# Patient Record
Sex: Female | Born: 1980
Health system: Southern US, Academic
[De-identification: ages and names within clinical notes are randomized; demographics above are authoritative.]

---

## 2019-12-27 ENCOUNTER — Other Ambulatory Visit (HOSPITAL_COMMUNITY): Payer: Self-pay

## 2019-12-27 ENCOUNTER — Telehealth (INDEPENDENT_AMBULATORY_CARE_PROVIDER_SITE_OTHER): Payer: Self-pay | Admitting: Neurology

## 2019-12-27 DIAGNOSIS — G250 Essential tremor: Secondary | ICD-10-CM

## 2020-01-10 ENCOUNTER — Telehealth (INDEPENDENT_AMBULATORY_CARE_PROVIDER_SITE_OTHER): Payer: Self-pay | Admitting: Neurology

## 2020-01-10 NOTE — Telephone Encounter (Signed)
Can see the neurosurgery team first if she is specifically interested in discussing HIFU, but otherwise seems like she needs a bigger discussion of tremor treatment prior to her being really ready for a procedural treatment for tremor.  I would NOT put her in [facility] clinic yet, until someone in-house evaluates her.     Shirlee More, MD  01/10/2020, 13:48

## 2020-01-10 NOTE — Telephone Encounter (Signed)
[facility]      Operated by [facility]    Date:  01/10/2020  Name: Kathy Anderson  MRN: [id number]  Age:  39 y.o.    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 Writing, eating, holding anything     Who is managing your disease?  Renee Pfeffier, CRNP    Medication management    Essential Tremor:  Pt is not currently on any medication for tremor due to trying to get pregnant.  She stated she was on one before but forgets what its called.     Any major thinking concerns? Pt admits to be experiencing increased forgetfulness    Surgical Questionnaire:   Are you interested in surgery (DBS) or HIFU? Yes - HIFU   Would surgery improve your QOL or ability to perform ADL's?? Yes  Any implanted devices or stimulators? NO  Any use of anti-coagulation? NO    Clinic to be assigned: [facility]. Referral sent to Walker Kehr and Ralene Cork for review and Neurosurgeon assignment if applicable.  Awaiting response prior to scheduling. Screening tool routed to Walker Kehr and [employer].       Patient advised that they must bring all relevant notes and imaging studies as available from their PCP, Neurologist, and/or Psychiatrist to their appointment.     Kathy Anderson Kathy Anderson  01/10/2020, 12:11

## 2020-01-21 ENCOUNTER — Telehealth (INDEPENDENT_AMBULATORY_CARE_PROVIDER_SITE_OTHER): Payer: Self-pay | Admitting: Neurological Surgery

## 2023-09-12 IMAGING — MR MRI CERVICAL SPINE WITHOUT AND WITH CONTRAST
8 series · 46 of 48 positions shown · IV contrast (10ml prohance)
Comparison: none

﻿

Pertinent Hx:    Muscle weakness.  Possible MS.
TECHNIQUE: T1-weighted, T2-weighted, and inversion recovery sagittal images of the cervical spine were taken.  T1-weighted, T2-weighted, and T2* axial images were also obtained.   Intravenous 10 mL ProHance contrast material was given.  Enhanced images were taken in axial and sagittal planes.

[Series 3: STIR · sagittal · 3.0mm · 0.39mm/px · 4 of 15 slices shown]
[im 1/15]
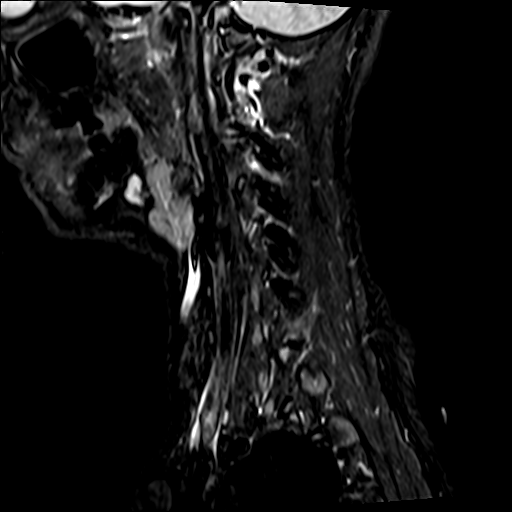
[im 5/15]
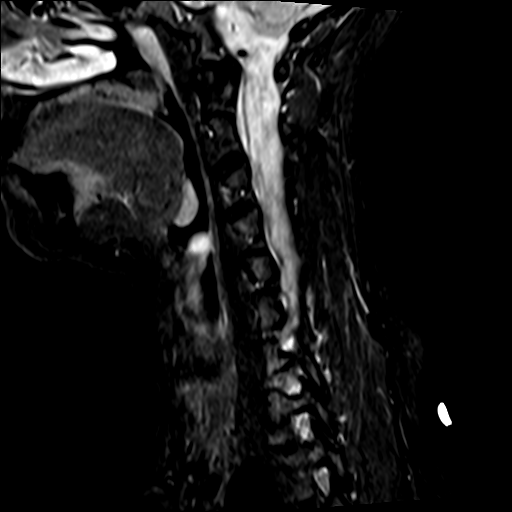
[im 10/15]
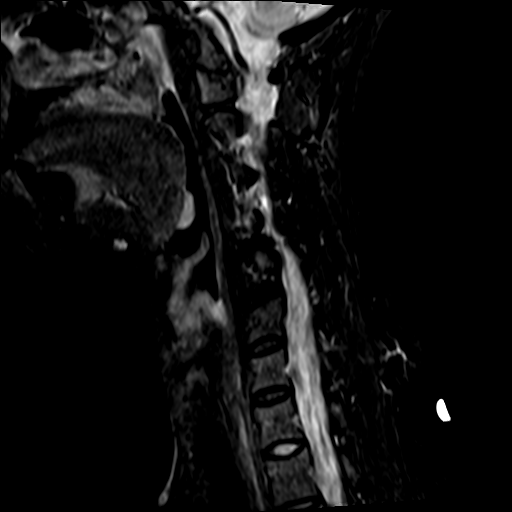
[im 15/15]
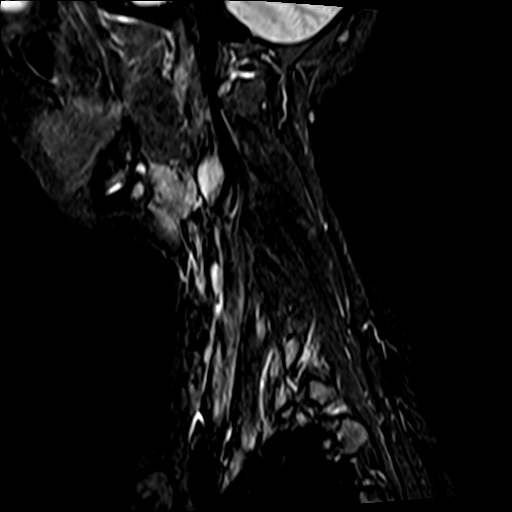

[Series 4: T2 · sagittal · 3.0mm · 0.39mm/px · 4 of 15 slices shown (1 of 3)]
[im 1/15]
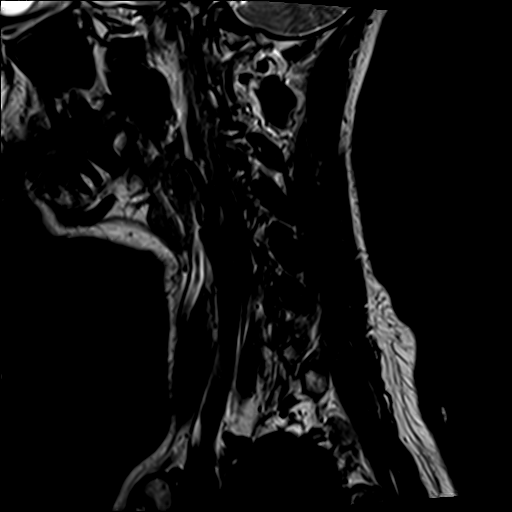
[im 5/15]
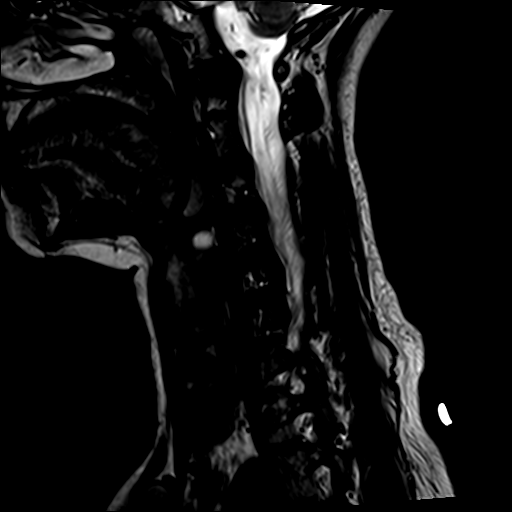
[im 10/15]
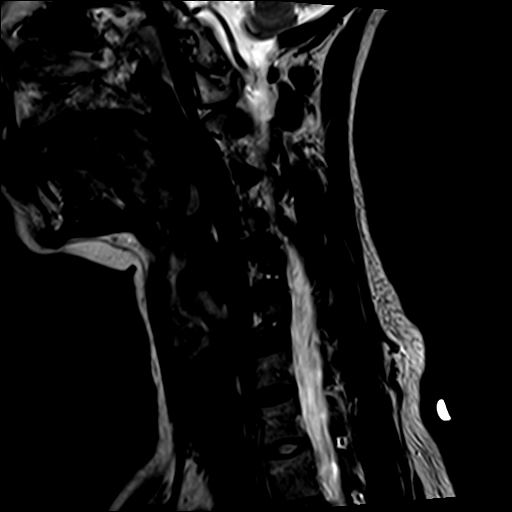
[im 15/15]
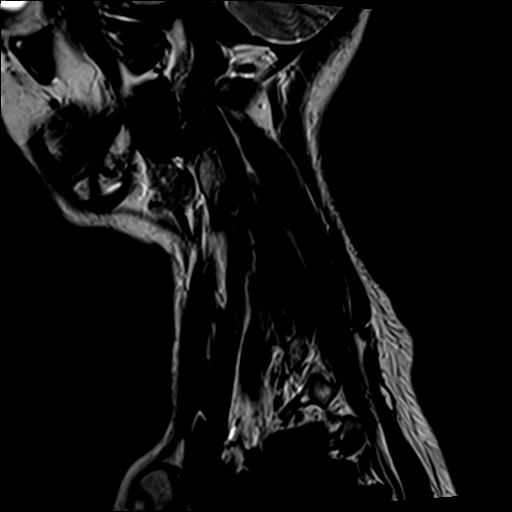

[Series 5: T2 · axial · 4.0mm · 0.62mm/px · z∈[-206,-89]mm · 8 of 25 slices shown (2 of 3)]
[im 1/25]
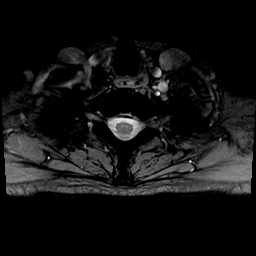
[im 4/25]
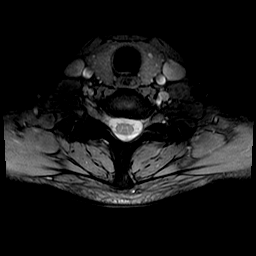
[im 7/25]
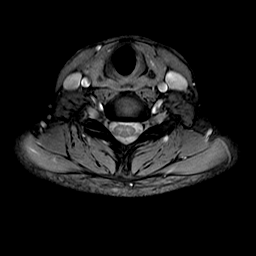
[im 11/25]
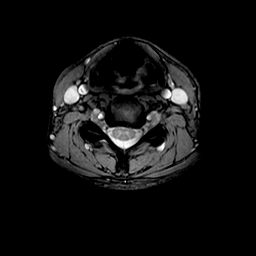
[im 14/25]
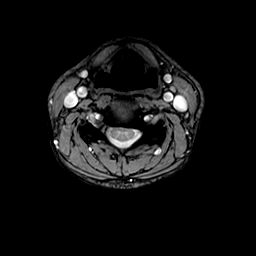
[im 18/25]
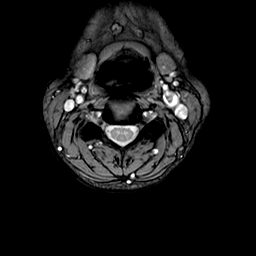
[im 21/25]
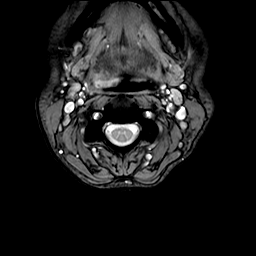
[im 25/25]
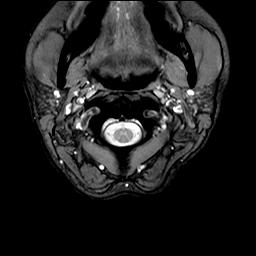

[Series 6: T1 · axial · 4.0mm · 0.62mm/px · z∈[-206,-89]mm · 8 of 25 slices shown (1 of 2)]
[im 1/25]
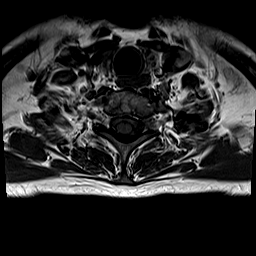
[im 4/25]
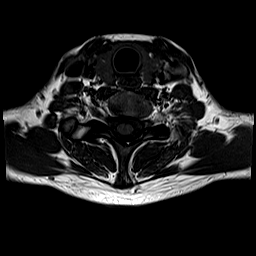
[im 7/25]
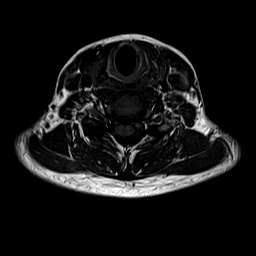
[im 11/25]
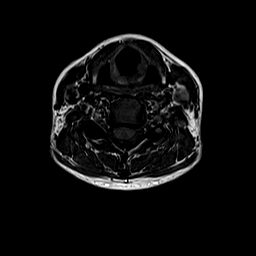
[im 14/25]
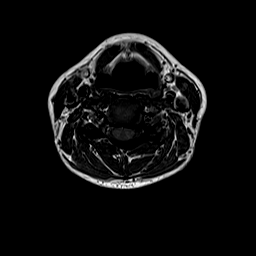
[im 18/25]
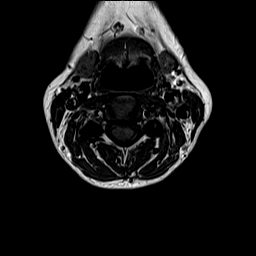
[im 21/25]
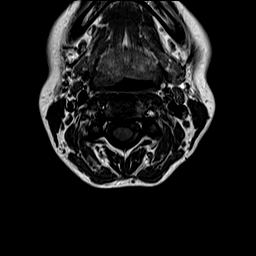
[im 25/25]
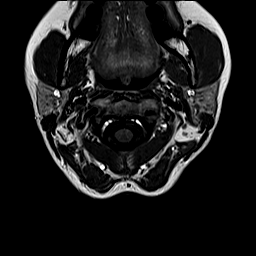

[Series 7: T2 · axial · 4.0mm · 0.62mm/px · z∈[-206,-89]mm · 8 of 25 slices shown (3 of 3)]
[im 1/25]
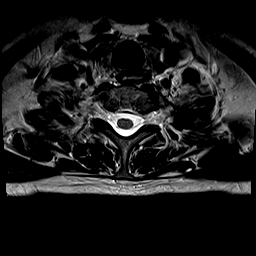
[im 4/25]
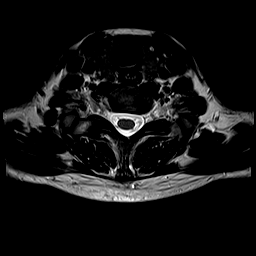
[im 7/25]
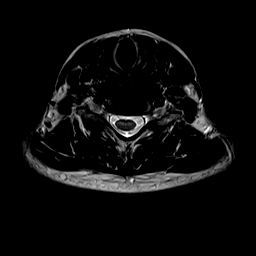
[im 11/25]
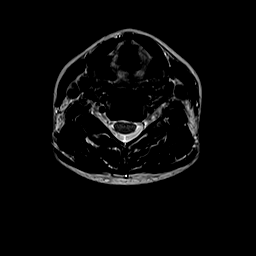
[im 14/25]
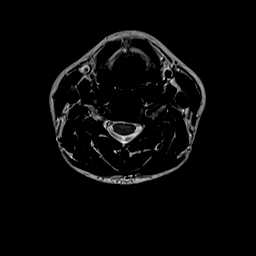
[im 18/25]
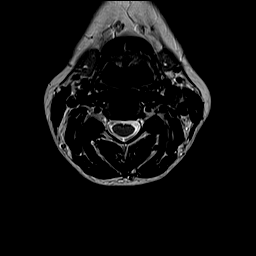
[im 21/25]
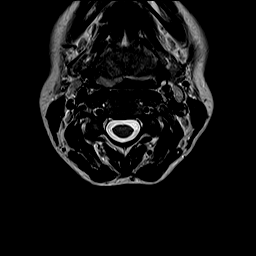
[im 25/25]
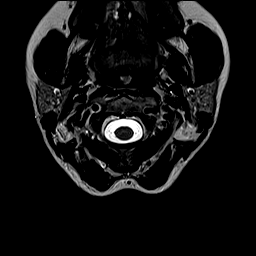

[Series 8: T1 · sagittal · 3.0mm · 0.78mm/px · 4 of 15 slices shown (2 of 2)]
[im 1/15]
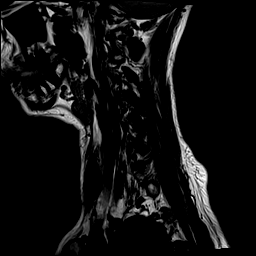
[im 5/15]
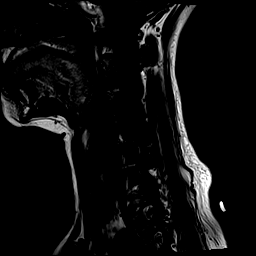
[im 10/15]
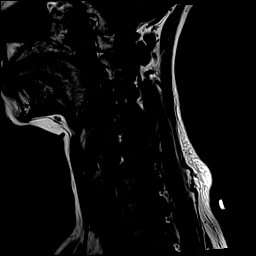
[im 15/15]
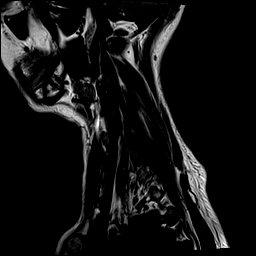

[Series 9: T1 post-contrast · sagittal · 3.0mm · 0.78mm/px · 4 of 15 slices shown (1 of 2)]
[im 1/15]
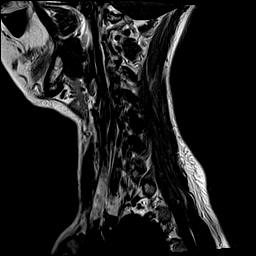
[im 5/15]
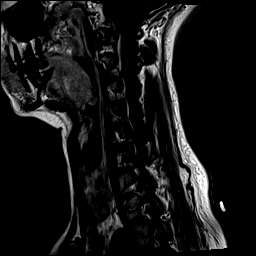
[im 10/15]
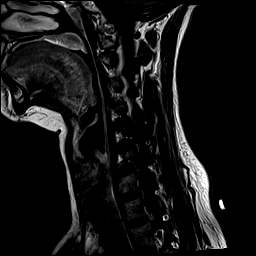
[im 15/15]
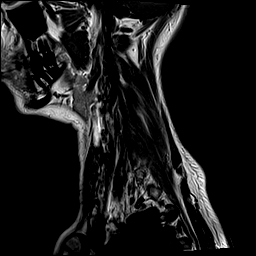

[Series 10: T1 post-contrast · axial · 4.0mm · 0.62mm/px · z∈[-206,-123]mm · 6 of 25 slices shown (2 of 2)]
[im 1/25]
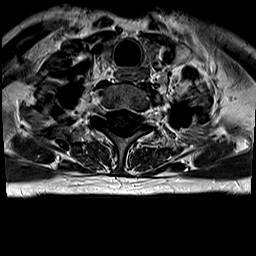
[im 4/25]
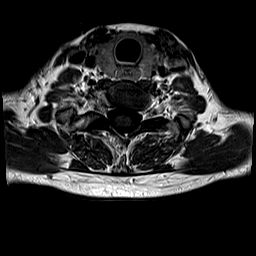
[im 7/25]
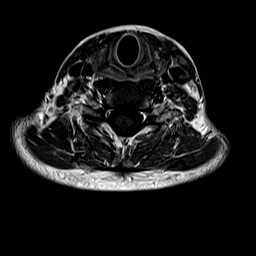
[im 11/25]
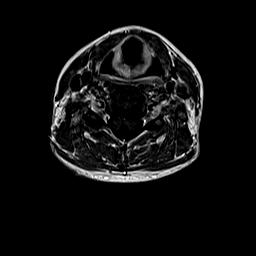
[im 14/25]
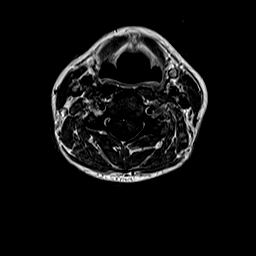
[im 18/25]
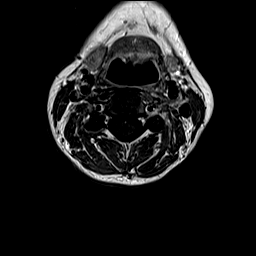

[46 of 48 positions shown; findings below may reference images not displayed]

FINDINGS: Alignment is normal.  

The spinal cord appears normal.  No lesion identified.  No abnormal enhancement.  

At C4-5 there is left posterolateral disc protrusion.  Left sided nerve root impingement is possible.  

Other cervical discs appear normal.  No other disc protrusion or extrusion identified.  

There is no evidence of central canal spinal stenosis.  No evidence of foraminal stenosis.  

Paraspinal tissues and the visualized portion of the neck show no abnormality.
IMPRESSION: 1. Left posterolateral disc protrusion C4-5.  Possible left sided nerve root impingement.  

2. Normal spinal cord with no lesion identified.

## 2023-09-12 IMAGING — MR MRI THORACIC SPINE WITHOUT AND WITH CONTRAST
6 of 10 series · 31 of 48 positions shown · IV contrast (prohance)
Comparison: none

﻿

Pertinent Hx:    Muscle weakness.  Possible MS.
TECHNIQUE: T1-weighted, T2-weighted, and inversion recovery sagittal images of the thoracic spine were taken.  T1 and T2-weighted axial images were also obtained.   Intravenous 10 mL ProHance contrast material was given.  Enhanced images were taken in axial and sagittal planes.

[Series 5: T2 · sagittal · 3.0mm · 1.03mm/px · 3 of 21 slices shown (1 of 2)]
[im 1/21]
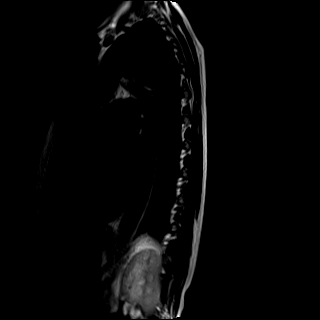
[im 11/21]
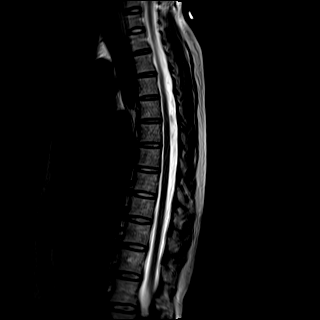
[im 21/21]
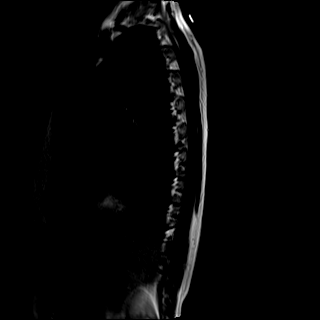

[Series 6: STIR · sagittal · 3.0mm · 1.00mm/px · 3 of 21 slices shown]
[im 1/21]
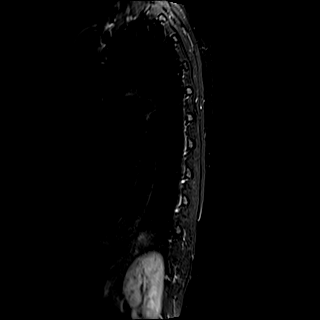
[im 11/21]
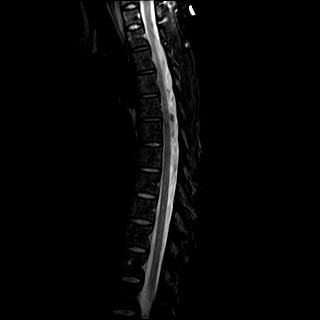
[im 21/21]
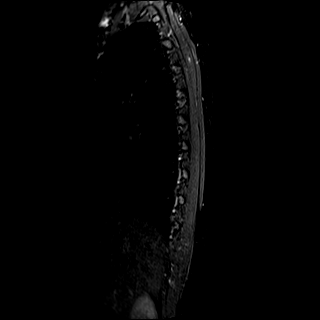

[Series 7: T2 · axial · 5.0mm · 0.70mm/px · z∈[-472,-202]mm · 9 of 59 slices shown (2 of 2)]
[im 1/59]
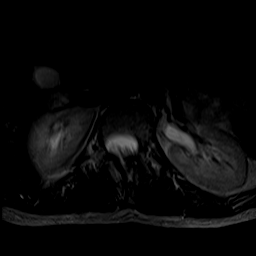
[im 8/59]
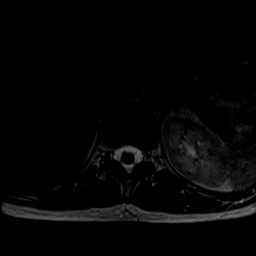
[im 15/59]
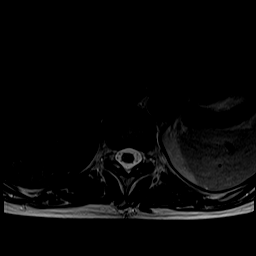
[im 22/59]
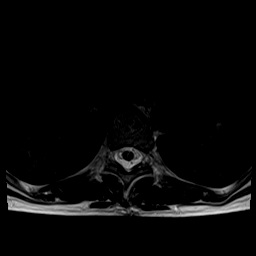
[im 30/59]
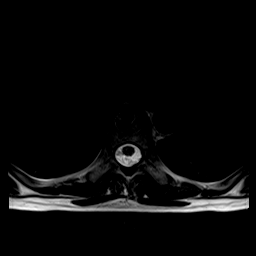
[im 37/59]
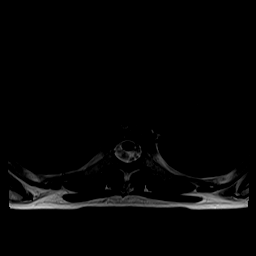
[im 44/59]
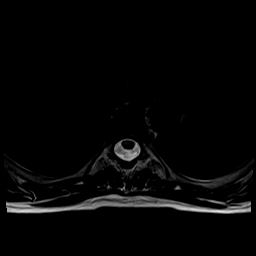
[im 51/59]
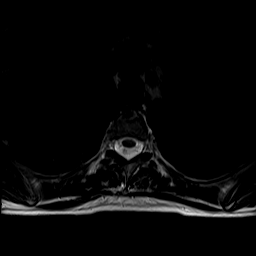
[im 59/59]
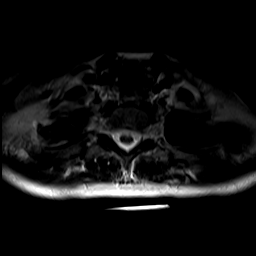

[Series 8: T1 · axial · 5.0mm · 0.35mm/px · z∈[-472,-202]mm · 9 of 59 slices shown (1 of 2)]
[im 1/59]
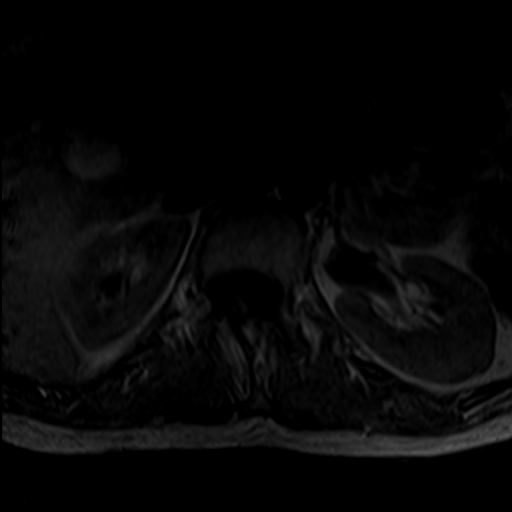
[im 8/59]
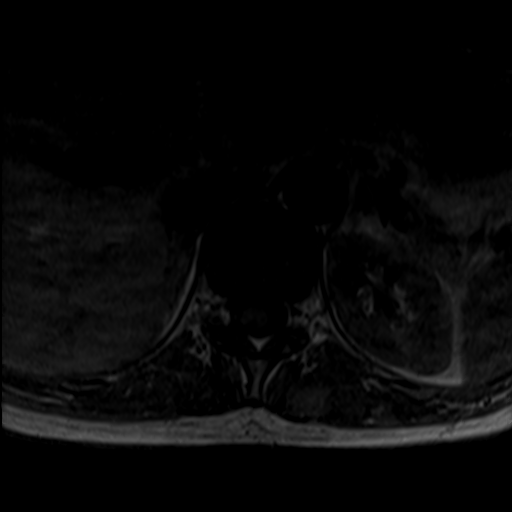
[im 15/59]
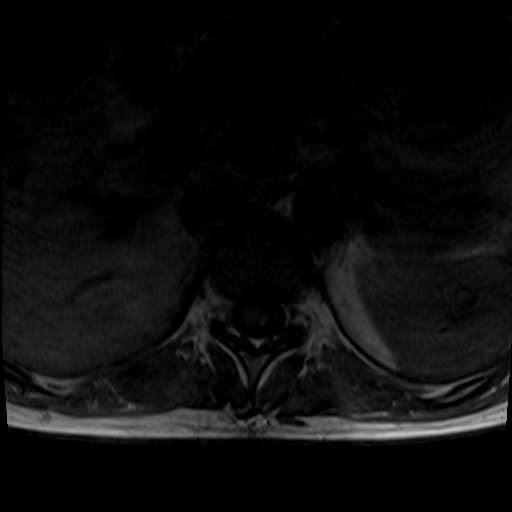
[im 22/59]
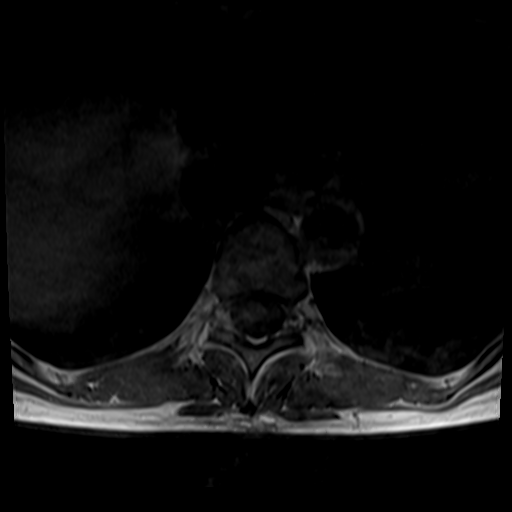
[im 30/59]
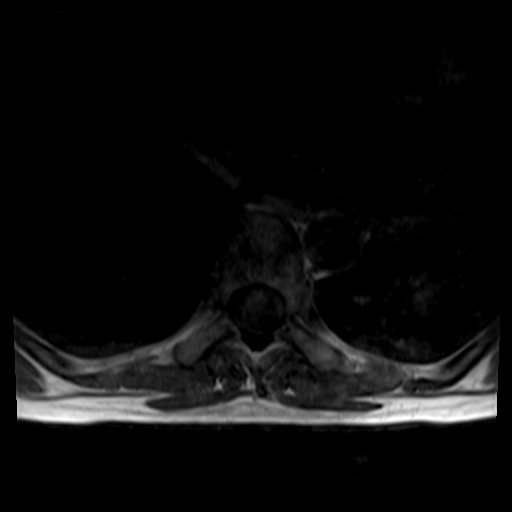
[im 37/59]
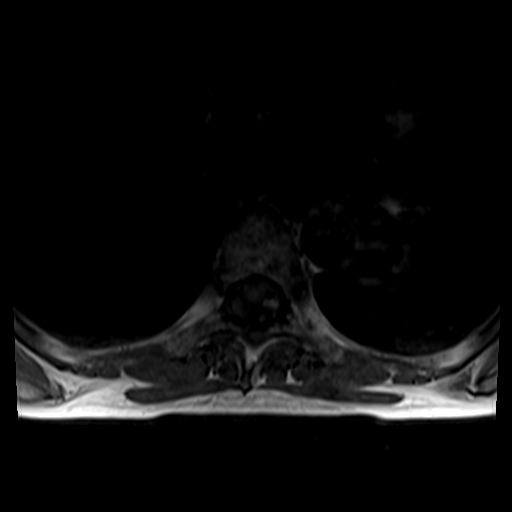
[im 44/59]
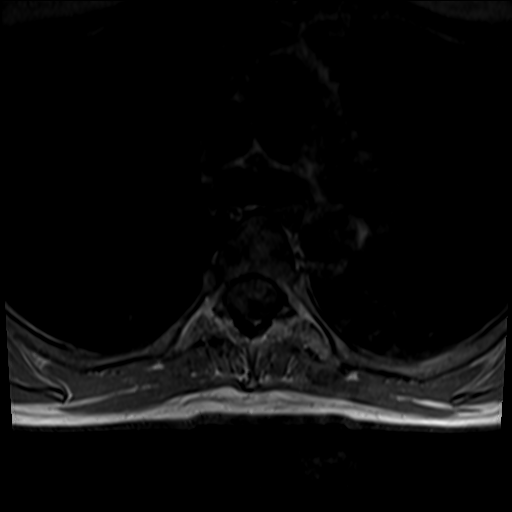
[im 51/59]
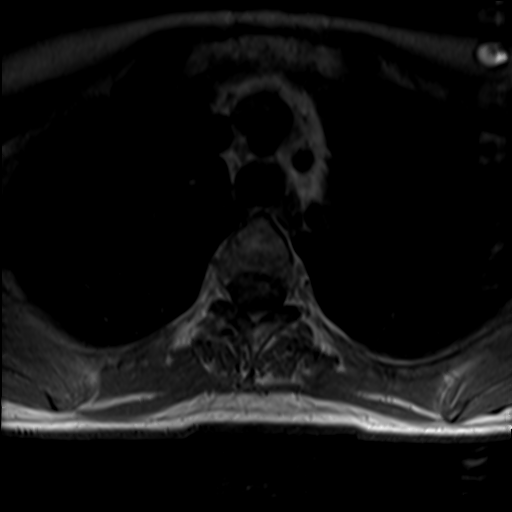
[im 59/59]
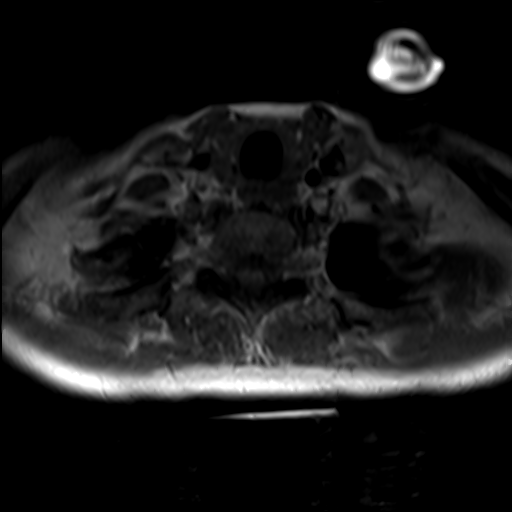

[Series 9: T1 · sagittal · 3.0mm · 1.00mm/px · 3 of 21 slices shown (2 of 2)]
[im 1/21]
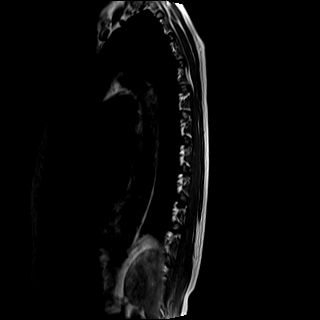
[im 11/21]
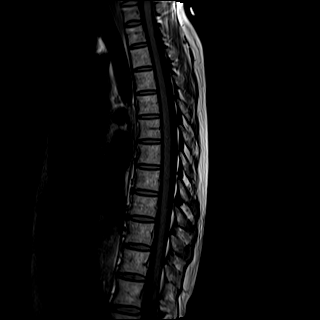
[im 21/21]
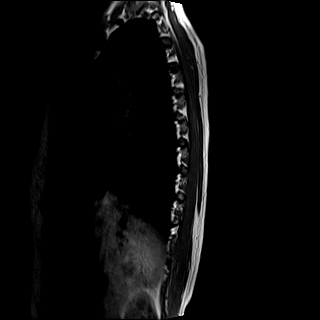

[Series 10: T1 post-contrast · axial · 5.0mm · 0.35mm/px · z∈[-472,-369]mm · 4 of 58 slices shown]
[im 1/58]
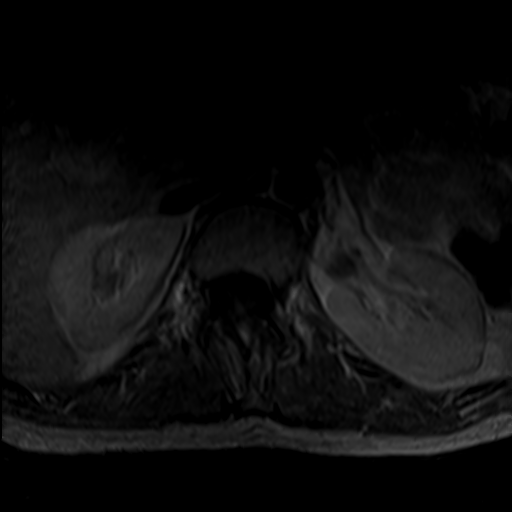
[im 8/58]
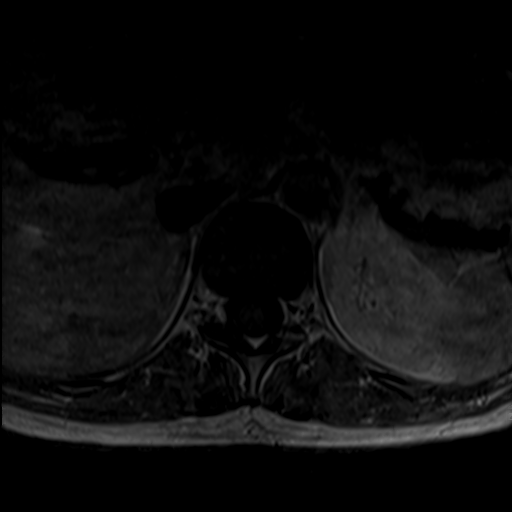
[im 15/58]
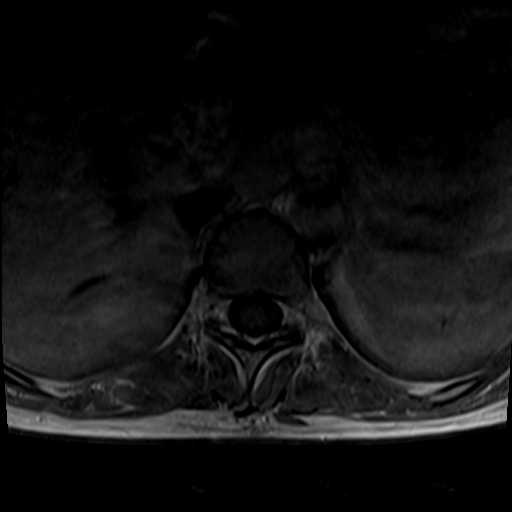
[im 22/58]
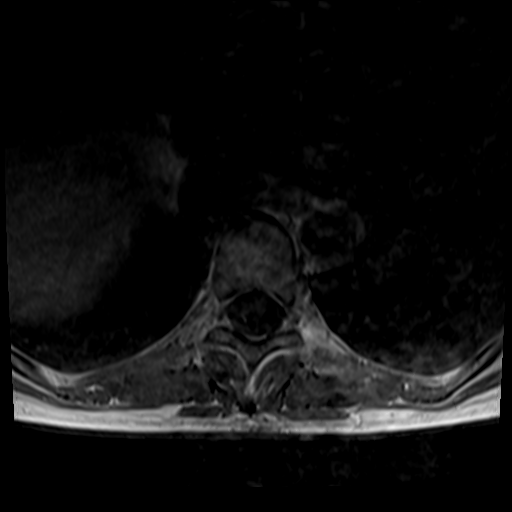

[31 of 48 positions shown; findings below may reference images not displayed]

FINDINGS: No abnormality can be identified.  

The spinal cord appears normal.  No lesion can be identified.  

There is no abnormal contrast enhancement.  

There is no disc protrusion or extrusion.  

No evidence of central canal spinal stenosis.  

Paraspinal muscles appear normal.   

No abnormality in the chest or abdomen identified.
IMPRESSION: Normal MRI of the thoracic spine.  No abnormality identified.

## 2023-09-12 IMAGING — MR MRI BRAIN WITHOUT AND WITH CONTRAST
9 of 12 series · 36 of 48 positions shown · IV contrast (prohance)
Comparison: none

﻿

Pertinent Hx:    Muscle weakness.  Possible MS.
TECHNIQUE: T1-weighted, T2-weighted, FLAIR, and gradient echo axial images of the brain were taken.  T1-weighted sagittal and T2-weighted coronal images were also obtained.  Diffusion-weighted imaging was performed.  Intravenous 10 mL ProHance contrast material was given.  T1-weighted enhanced images were taken in axial, coronal, and sagittal planes.

[Series 2: T1 · sagittal · 5.0mm · 0.47mm/px · 2 of 24 slices shown (1 of 4)]
[im 1/24]
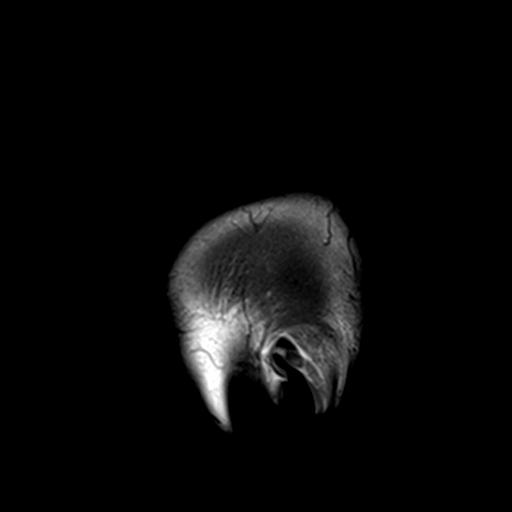
[im 24/24]
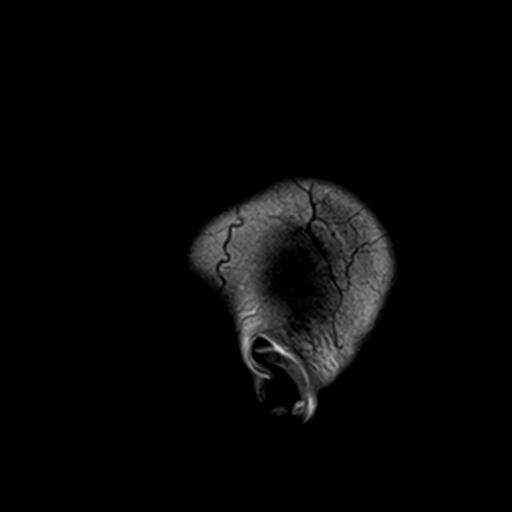

[Series 3: T2 · coronal · 5.0mm · 0.69mm/px · 5 of 33 slices shown (1 of 3)]
[im 1/33]
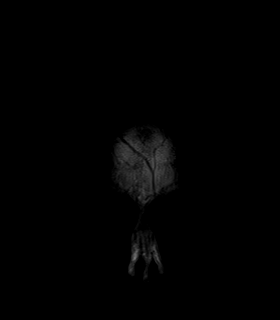
[im 9/33]
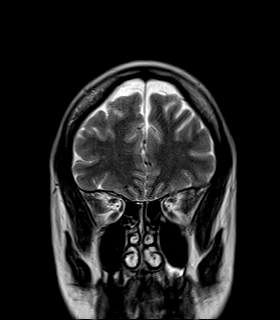
[im 17/33]
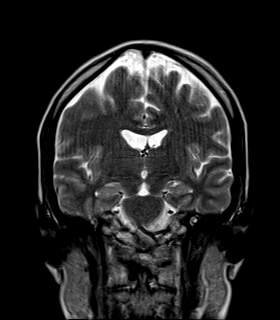
[im 25/33]
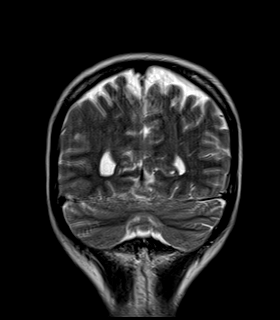
[im 33/33]
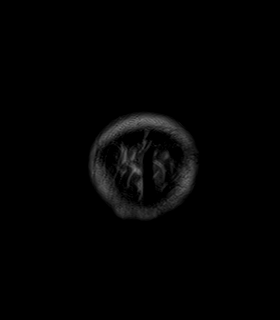

[Series 4: T2 · axial · 5.0mm · 0.69mm/px · z∈[-66,+88]mm · 4 of 29 slices shown (2 of 3)]
[im 1/29]
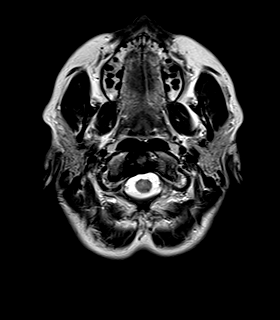
[im 10/29]
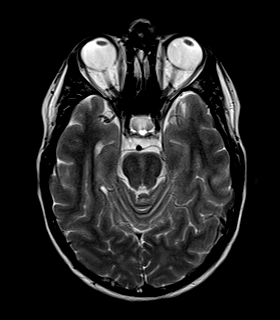
[im 19/29]
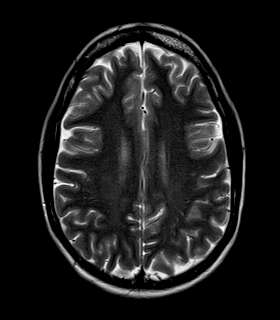
[im 29/29]
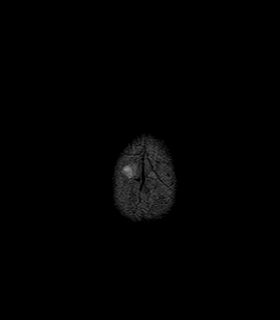

[Series 5: T1 · axial · 5.0mm · 0.69mm/px · z∈[-66,+88]mm · 4 of 29 slices shown (2 of 4)]
[im 1/29]
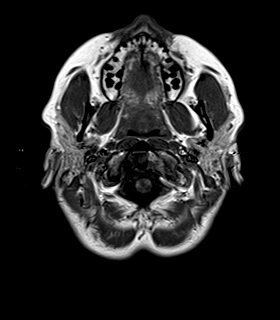
[im 10/29]
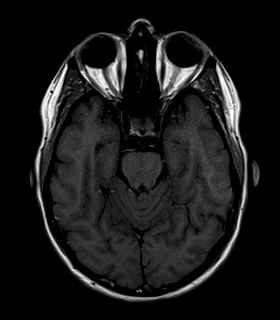
[im 19/29]
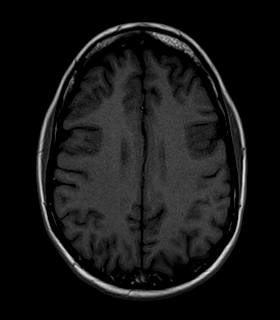
[im 29/29]
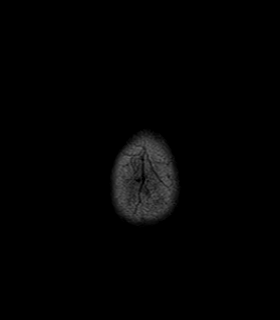

[Series 6: T2 · coronal · 5.0mm · 0.69mm/px · 5 of 33 slices shown (3 of 3)]
[im 1/33]
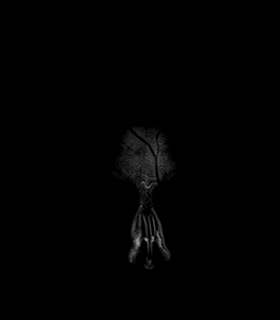
[im 9/33]
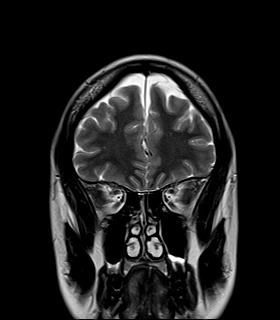
[im 17/33]
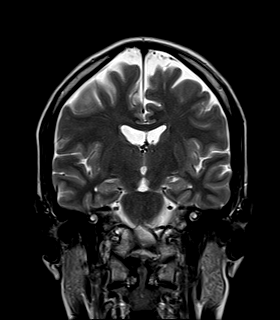
[im 25/33]
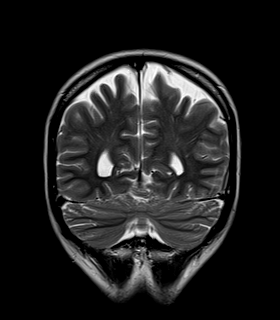
[im 33/33]
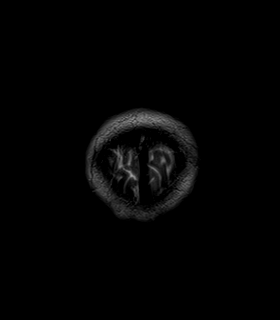

[Series 7: FLAIR · axial · 5.0mm · 0.43mm/px · z∈[-66,+88]mm · 4 of 29 slices shown]
[im 1/29]
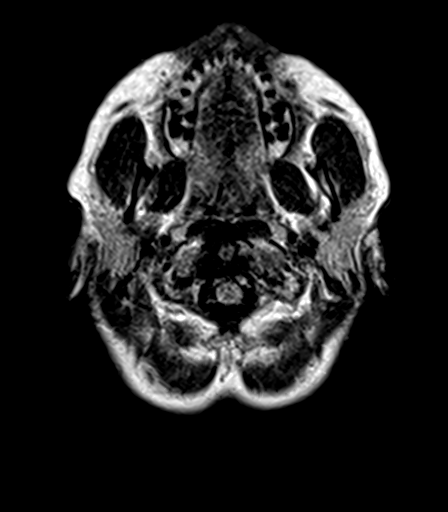
[im 10/29]
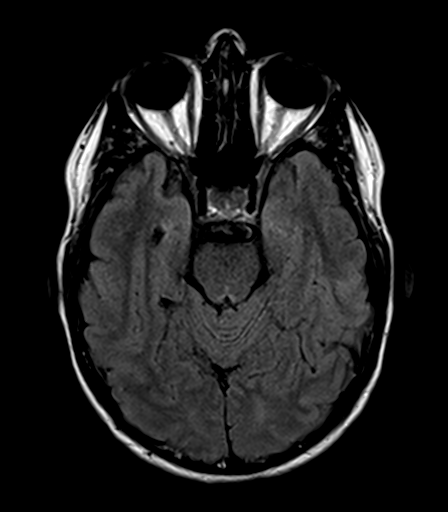
[im 19/29]
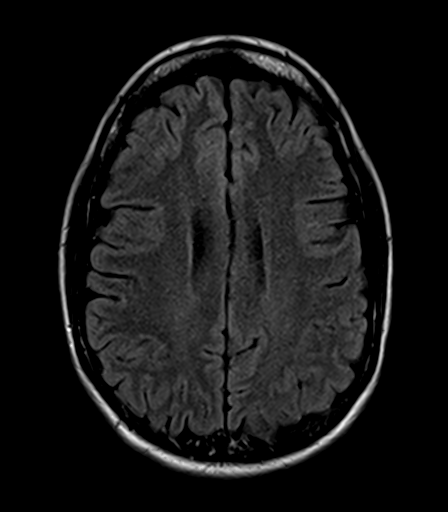
[im 29/29]
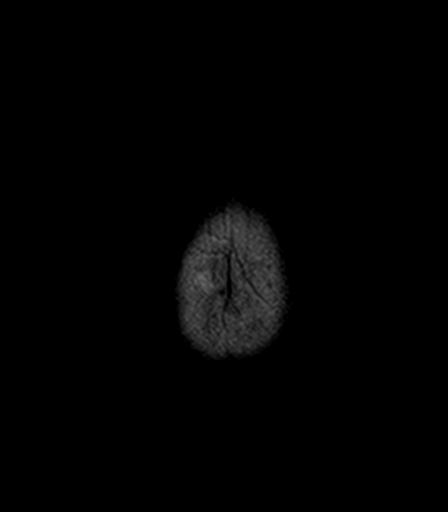

[Series 8: GRE · axial · 5.0mm · 0.43mm/px · z∈[-65,+88]mm · 4 of 29 slices shown]
[im 1/29]
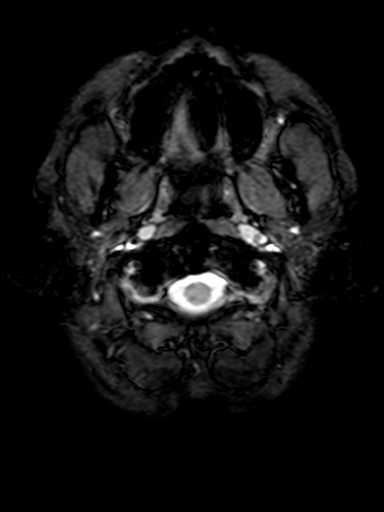
[im 10/29]
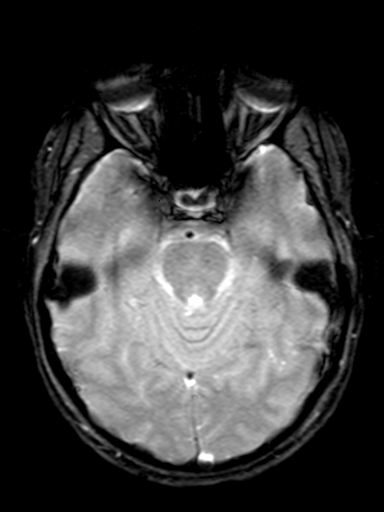
[im 19/29]
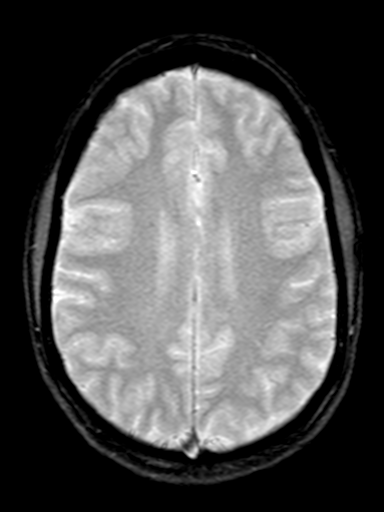
[im 29/29]
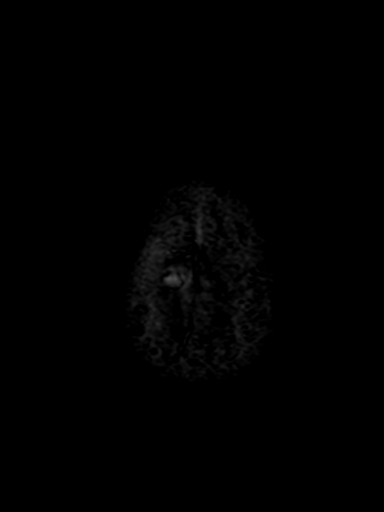

[Series 11: T1 · axial · 5.0mm · 0.86mm/px · z∈[-66,+88]mm · 4 of 29 slices shown (3 of 4)]
[im 1/29]
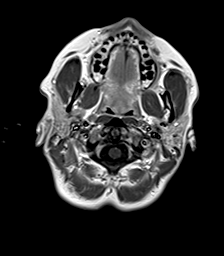
[im 10/29]
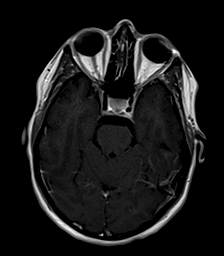
[im 19/29]
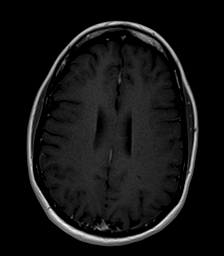
[im 29/29]
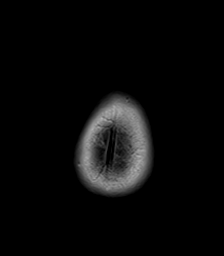

[Series 12: T1 · coronal · 5.0mm · 0.43mm/px · 4 of 33 slices shown (4 of 4)]
[im 1/33]
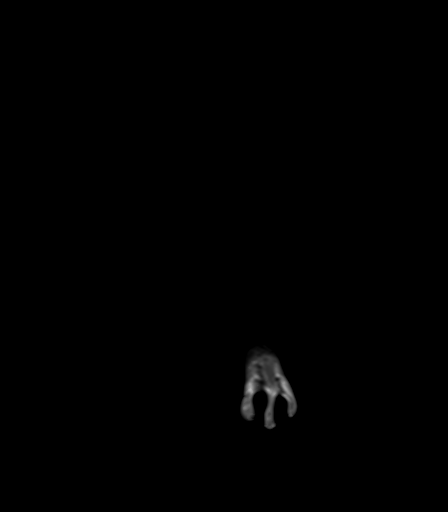
[im 9/33]
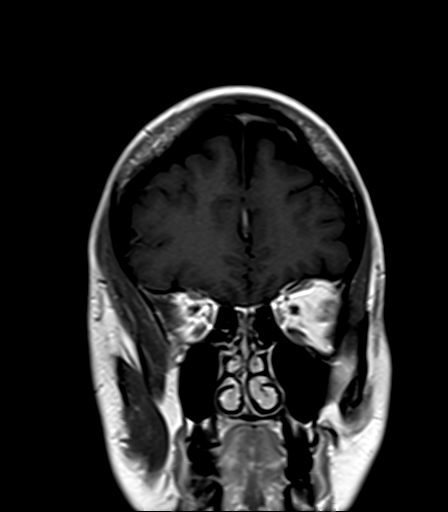
[im 17/33]
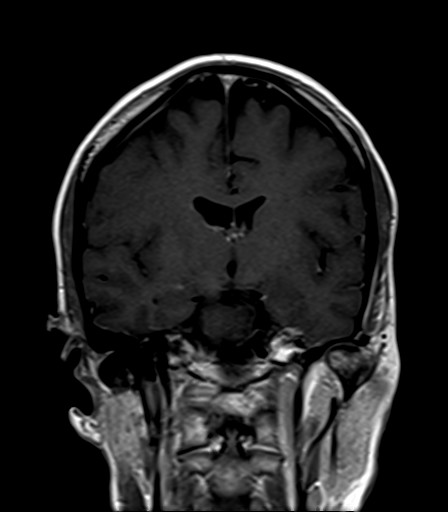
[im 25/33]
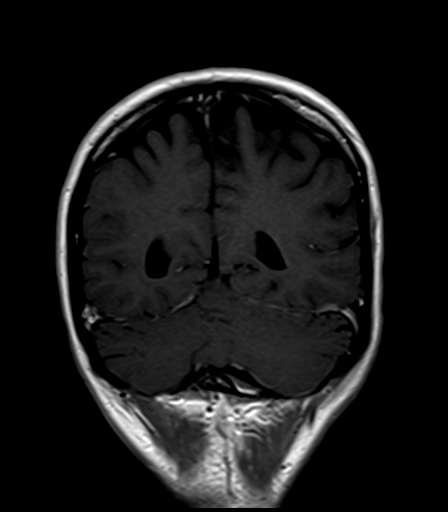

[36 of 48 positions shown; findings below may reference images not displayed]

FINDINGS: The brain appears normal.  No lesion can be identified.  

No enhancing abnormality is evident.  

No hemorrhage.  

Diffusion-weighted images are normal. 

Ventricles are normal in size and position.  

No abnormality around the brain identified.
IMPRESSION: Normal MRI of the brain.  No abnormality identified.
